# Patient Record
Sex: Male | Born: 1975 | Race: White | Hispanic: No | Marital: Single | Smoking: Current every day smoker
Health system: Southern US, Community
[De-identification: ages and names within clinical notes are randomized; demographics above are authoritative.]

---

## 2002-08-31 ENCOUNTER — Emergency Department (HOSPITAL_COMMUNITY): Admission: EM | Admit: 2002-08-31 | Discharge: 2002-08-31 | Payer: Self-pay | Admitting: Emergency Medicine

## 2014-08-20 ENCOUNTER — Emergency Department: Payer: Self-pay | Admitting: Emergency Medicine

## 2014-08-20 LAB — COMPREHENSIVE METABOLIC PANEL
ALK PHOS: 58 U/L
ALT: 36 U/L
ANION GAP: 9 (ref 7–16)
Albumin: 4 g/dL (ref 3.4–5.0)
BILIRUBIN TOTAL: 0.5 mg/dL (ref 0.2–1.0)
BUN: 13 mg/dL (ref 7–18)
CALCIUM: 8.7 mg/dL (ref 8.5–10.1)
CHLORIDE: 105 mmol/L (ref 98–107)
Co2: 27 mmol/L (ref 21–32)
Creatinine: 0.96 mg/dL (ref 0.60–1.30)
EGFR (African American): >60
EGFR (Non-African Amer.): >60
GLUCOSE: 114 mg/dL — AB (ref 65–99)
OSMOLALITY: 282 (ref 275–301)
POTASSIUM: 3.7 mmol/L (ref 3.5–5.1)
SGOT(AST): 44 U/L — ABNORMAL HIGH (ref 15–37)
Sodium: 141 mmol/L (ref 136–145)
TOTAL PROTEIN: 7.4 g/dL (ref 6.4–8.2)

## 2014-08-20 LAB — URINALYSIS, COMPLETE
BILIRUBIN, UR: NEGATIVE
Bacteria: NONE SEEN
GLUCOSE, UR: NEGATIVE mg/dL (ref 0–75)
Leukocyte Esterase: NEGATIVE
Nitrite: NEGATIVE
PROTEIN: NEGATIVE
Ph: 6 (ref 4.5–8.0)
RBC,UR: 7 /HPF (ref 0–5)
Specific Gravity: 1.026 (ref 1.003–1.030)
Squamous Epithelial: <1

## 2014-08-20 LAB — CBC WITH DIFFERENTIAL/PLATELET
BASOS PCT: 1.1 %
Basophil #: 0.1 10*3/uL (ref 0.0–0.1)
EOS ABS: 0.2 10*3/uL (ref 0.0–0.7)
Eosinophil %: 2.8 %
HCT: 46.7 % (ref 40.0–52.0)
HGB: 15.9 g/dL (ref 13.0–18.0)
Lymphocyte #: 1.9 10*3/uL (ref 1.0–3.6)
Lymphocyte %: 21.8 %
MCH: 31.4 pg (ref 26.0–34.0)
MCHC: 34 g/dL (ref 32.0–36.0)
MCV: 93 fL (ref 80–100)
MONO ABS: 0.5 x10 3/mm (ref 0.2–1.0)
Monocyte %: 5.8 %
Neutrophil #: 6 10*3/uL (ref 1.4–6.5)
Neutrophil %: 68.5 %
PLATELETS: 198 10*3/uL (ref 150–440)
RBC: 5.05 10*6/uL (ref 4.40–5.90)
RDW: 13 % (ref 11.5–14.5)
WBC: 8.8 10*3/uL (ref 3.8–10.6)

## 2014-08-20 LAB — LIPASE, BLOOD: Lipase: 150 U/L (ref 73–393)

## 2015-04-12 ENCOUNTER — Emergency Department
Admission: EM | Admit: 2015-04-12 | Discharge: 2015-04-12 | Disposition: A | Discharge status: Home / Self Care | Payer: Worker's Compensation | Attending: Emergency Medicine | Admitting: Emergency Medicine

## 2015-04-12 ENCOUNTER — Encounter: Payer: Self-pay | Admitting: Emergency Medicine

## 2015-04-12 DIAGNOSIS — Y990 Civilian activity done for income or pay: Secondary | ICD-10-CM | POA: Diagnosis not present

## 2015-04-12 DIAGNOSIS — Y288XXA Contact with other sharp object, undetermined intent, initial encounter: Secondary | ICD-10-CM | POA: Insufficient documentation

## 2015-04-12 DIAGNOSIS — Y9389 Activity, other specified: Secondary | ICD-10-CM | POA: Diagnosis not present

## 2015-04-12 DIAGNOSIS — Z720 Tobacco use: Secondary | ICD-10-CM | POA: Insufficient documentation

## 2015-04-12 DIAGNOSIS — S61011A Laceration without foreign body of right thumb without damage to nail, initial encounter: Secondary | ICD-10-CM | POA: Insufficient documentation

## 2015-04-12 DIAGNOSIS — Y9289 Other specified places as the place of occurrence of the external cause: Secondary | ICD-10-CM | POA: Insufficient documentation

## 2015-04-12 MED ORDER — BACITRACIN-NEOMYCIN-POLYMYXIN 400-5-5000 EX OINT
TOPICAL_OINTMENT | CUTANEOUS | Status: AC
  Filled 2015-04-12: qty 1

## 2015-04-12 MED ORDER — LIDOCAINE HCL (PF) 1 % IJ SOLN
INTRAMUSCULAR | Status: AC
  Filled 2015-04-12: qty 5

## 2015-04-12 NOTE — ED Notes (Signed)
Lac to right thumb, working on his own car at work, cut with metal

## 2015-04-12 NOTE — Discharge Instructions (Signed)
Laceration Care, Adult °A laceration is a cut or lesion that goes through all layers of the skin and into the tissue just beneath the skin. °TREATMENT  °Some lacerations may not require closure. Some lacerations may not be able to be closed due to an increased risk of infection. It is important to see your caregiver as soon as possible after an injury to minimize the risk of infection and maximize the opportunity for successful closure. °If closure is appropriate, pain medicines may be given, if needed. The wound will be cleaned to help prevent infection. Your caregiver will use stitches (sutures), staples, wound glue (adhesive), or skin adhesive strips to repair the laceration. These tools bring the skin edges together to allow for faster healing and a better cosmetic outcome. However, all wounds will heal with a scar. Once the wound has healed, scarring can be minimized by covering the wound with sunscreen during the day for 1 full year. °HOME CARE INSTRUCTIONS  °For sutures or staples: °· Keep the wound clean and dry. °· If you were given a bandage (dressing), you should change it at least once a day. Also, change the dressing if it becomes wet or dirty, or as directed by your caregiver. °· Wash the wound with soap and water 2 times a day. Rinse the wound off with water to remove all soap. Pat the wound dry with a clean towel. °· After cleaning, apply a thin layer of the antibiotic ointment as recommended by your caregiver. This will help prevent infection and keep the dressing from sticking. °· You may shower as usual after the first 24 hours. Do not soak the wound in water until the sutures are removed. °· Only take over-the-counter or prescription medicines for pain, discomfort, or fever as directed by your caregiver. °· Get your sutures or staples removed as directed by your caregiver. °For skin adhesive strips: °· Keep the wound clean and dry. °· Do not get the skin adhesive strips wet. You may bathe  carefully, using caution to keep the wound dry. °· If the wound gets wet, pat it dry with a clean towel. °· Skin adhesive strips will fall off on their own. You may trim the strips as the wound heals. Do not remove skin adhesive strips that are still stuck to the wound. They will fall off in time. °For wound adhesive: °· You may briefly wet your wound in the shower or bath. Do not soak or scrub the wound. Do not swim. Avoid periods of heavy perspiration until the skin adhesive has fallen off on its own. After showering or bathing, gently pat the wound dry with a clean towel. °· Do not apply liquid medicine, cream medicine, or ointment medicine to your wound while the skin adhesive is in place. This may loosen the film before your wound is healed. °· If a dressing is placed over the wound, be careful not to apply tape directly over the skin adhesive. This may cause the adhesive to be pulled off before the wound is healed. °· Avoid prolonged exposure to sunlight or tanning lamps while the skin adhesive is in place. Exposure to ultraviolet light in the first year will darken the scar. °· The skin adhesive will usually remain in place for 5 to 10 days, then naturally fall off the skin. Do not pick at the adhesive film. °You may need a tetanus shot if: °· You cannot remember when you had your last tetanus shot. °· You have never had a tetanus   shot. If you get a tetanus shot, your arm may swell, get red, and feel warm to the touch. This is common and not a problem. If you need a tetanus shot and you choose not to have one, there is a rare chance of getting tetanus. Sickness from tetanus can be serious. SEEK MEDICAL CARE IF:   You have redness, swelling, or increasing pain in the wound.  You see a red line that goes away from the wound.  You have yellowish-white fluid (pus) coming from the wound.  You have a fever.  You notice a bad smell coming from the wound or dressing.  Your wound breaks open before or  after sutures have been removed.  You notice something coming out of the wound such as wood or glass.  Your wound is on your hand or foot and you cannot move a finger or toe. SEEK IMMEDIATE MEDICAL CARE IF:   Your pain is not controlled with prescribed medicine.  You have severe swelling around the wound causing pain and numbness or a change in color in your arm, hand, leg, or foot.  Your wound splits open and starts bleeding.  You have worsening numbness, weakness, or loss of function of any joint around or beyond the wound.  You develop painful lumps near the wound or on the skin anywhere on your body. MAKE SURE YOU:   Understand these instructions.  Will watch your condition.  Will get help right away if you are not doing well or get worse. Document Released: 10/08/2005 Document Revised: 12/31/2011 Document Reviewed: 04/03/2011 [facility] Patient Information 2015 [place], [place]. This information is not intended to replace advice given to you by your health care provider. Make sure you discuss any questions you have with your health care provider.  Keep the wound clean, dry, and covered.  Return for wound check as needed.  Return in 7-10 days for suture removal.

## 2015-04-12 NOTE — ED Notes (Signed)
Neosporin dsy to sutured area 

## 2015-04-12 NOTE — ED Notes (Signed)
Pt reports was playing around with a car and cut his right hand thumb. Pt reports was cut on the metal window regulator. Bleeding controlled at this time. Pt with approx 0.5 in laceration noted. Last tetanus 2011

## 2015-04-12 NOTE — ED Notes (Signed)
Workers comp urine obtained

## 2015-04-13 NOTE — ED Provider Notes (Signed)
[facility] Emergency Department Provider Note ____________________________________________  Time seen: 1715  I have reviewed the triage vital signs and the nursing notes.  HISTORY  Chief Complaint  Extremity Laceration  HPI Adrian Jackson is a 39 y.o. male, right-hand dominant, with laceration to the right thumb while at work today. He describes working on his own car at work, when he inadvertently slid his hand on metal and the regulator, causing a cut to the palm side of his right thumb. Bleeding is controlled at this time, and he reports a tetanus status is up-to-date as of 2011.  No past medical history on file.  There are no active problems to display for this patient.  No past surgical history on file.  No current outpatient prescriptions on file.  Allergies Review of patient's allergies indicates not on file.  No family history on file.  Social History History  Substance Use Topics  . Smoking status: Current Every Day Smoker -- 1.00 packs/day    Types: Cigarettes  . Smokeless tobacco: Not on file  . Alcohol Use: No   Review of Systems  Constitutional: Negative for fever. Eyes: Negative for visual changes. ENT: Negative for sore throat. Cardiovascular: Negative for chest pain. Respiratory: Negative for shortness of breath. Gastrointestinal: Negative for abdominal pain, vomiting and diarrhea. Genitourinary: Negative for dysuria. Musculoskeletal: Negative for back pain. Skin: Negative for rash. Thumb laceration as above. Neurological: Negative for headaches, focal weakness or numbness. ____________________________________________  PHYSICAL EXAM:  VITAL SIGNS: ED Triage Vitals  Enc Vitals Group     BP 04/12/15 1616 152/97 mmHg     Pulse Rate 04/12/15 1616 90     Resp 04/12/15 1616 20     Temp 04/12/15 1616 97.7 F (36.5 C)     Temp Source 04/12/15 1616 Oral     SpO2 04/12/15 1616 99 %     Weight 04/12/15 1616 210 lb (95.255 kg)   Height 04/12/15 1616 6' (1.829 m)     Head Cir --      Peak Flow --      Pain Score 04/12/15 1618 3     Pain Loc --      Pain Edu? --      Excl. in GC? --    Constitutional: Alert and oriented. Well appearing and in no distress. Eyes: Conjunctivae are normal. PERRL. Normal extraocular movements. ENT   Head: Normocephalic and atraumatic.   Nose: No congestion/rhinnorhea.   Mouth/Throat: Mucous membranes are moist.   Neck: Supple. No thyromegaly. Hematological/Lymphatic/Immunilogical: No cervical lymphadenopathy. Cardiovascular: Normal distal pulses. Respiratory: Normal respiratory effort.  Musculoskeletal: Nontender with normal range of motion in all extremities. Normal right thumb ROM without lag.  Neurologic:  Normal gait without ataxia. Normal speech and language. No gross focal neurologic deficits are appreciated. Skin:  Skin is warm, dry and intact. No rash noted. Psychiatric: Mood and affect are normal. Patient exhibits appropriate insight and judgment. ____________________________________________  LACERATION REPAIR Performed by: Lissa Hoard Authorized by: Lissa Hoard Consent: Verbal consent obtained. Risks and benefits: risks, benefits and alternatives were discussed Consent given by: patient Patient identity confirmed: provided demographic data Prepped and Draped in normal sterile fashion Wound explored  Laceration Location: Right thumb  Laceration Length: 1.2cm  No Foreign Bodies seen or palpated  Anesthesia: local infiltration  Digital block anesthetic: lidocaine 1% w/o epinephrine  Anesthetic total: 1 ml  Irrigation method: syringe Amount of cleaning: standard  Skin closure: 5-0 nylon  Number of  sutures: 3  Technique: interrupted  Patient tolerance: Patient tolerated the procedure well with no immediate complications. ____________________________________________  INITIAL IMPRESSION / ASSESSMENT AND PLAN / ED  COURSE  Right thumb laceration s/p suture repair.  Wound care instructions given. Return to the ED in 7-10 days for suture removal. ____________________________________________  FINAL CLINICAL IMPRESSION(S) / ED DIAGNOSES  Final diagnoses:  Laceration of thumb, right, initial encounter     Lissa Hoard, PA-C 04/13/15 0022  Phineas Semen, MD 04/14/15 [phone]

## 2021-03-27 ENCOUNTER — Emergency Department: Payer: Worker's Compensation

## 2021-03-27 ENCOUNTER — Encounter: Payer: Self-pay | Admitting: Emergency Medicine

## 2021-03-27 ENCOUNTER — Other Ambulatory Visit: Payer: Self-pay

## 2021-03-27 ENCOUNTER — Emergency Department
Admission: EM | Admit: 2021-03-27 | Discharge: 2021-03-27 | Disposition: A | Discharge status: Home / Self Care | Payer: Worker's Compensation | Attending: Emergency Medicine | Admitting: Emergency Medicine

## 2021-03-27 DIAGNOSIS — Z23 Encounter for immunization: Secondary | ICD-10-CM | POA: Diagnosis not present

## 2021-03-27 DIAGNOSIS — W458XXA Other foreign body or object entering through skin, initial encounter: Secondary | ICD-10-CM | POA: Diagnosis not present

## 2021-03-27 DIAGNOSIS — F17210 Nicotine dependence, cigarettes, uncomplicated: Secondary | ICD-10-CM | POA: Insufficient documentation

## 2021-03-27 DIAGNOSIS — Z20822 Contact with and (suspected) exposure to covid-19: Secondary | ICD-10-CM | POA: Diagnosis not present

## 2021-03-27 DIAGNOSIS — S61439A Puncture wound without foreign body of unspecified hand, initial encounter: Secondary | ICD-10-CM

## 2021-03-27 DIAGNOSIS — S6991XA Unspecified injury of right wrist, hand and finger(s), initial encounter: Secondary | ICD-10-CM | POA: Insufficient documentation

## 2021-03-27 LAB — CBC WITH DIFFERENTIAL/PLATELET
Abs Immature Granulocytes: 0.03 10*3/uL (ref 0.00–0.07)
Basophils Absolute: 0.1 10*3/uL (ref 0.0–0.1)
Basophils Relative: 1 %
Eosinophils Absolute: 0.3 10*3/uL (ref 0.0–0.5)
Eosinophils Relative: 3 %
HCT: 44.2 % (ref 39.0–52.0)
Hemoglobin: 14.9 g/dL (ref 13.0–17.0)
Immature Granulocytes: 0 %
Lymphocytes Relative: 29 %
Lymphs Abs: 3.4 10*3/uL (ref 0.7–4.0)
MCH: 31 pg (ref 26.0–34.0)
MCHC: 33.7 g/dL (ref 30.0–36.0)
MCV: 92.1 fL (ref 80.0–100.0)
Monocytes Absolute: 0.8 10*3/uL (ref 0.1–1.0)
Monocytes Relative: 7 %
Neutro Abs: 7 10*3/uL (ref 1.7–7.7)
Neutrophils Relative %: 60 %
Platelets: 257 10*3/uL (ref 150–400)
RBC: 4.8 MIL/uL (ref 4.22–5.81)
RDW: 12.8 % (ref 11.5–15.5)
WBC: 11.7 10*3/uL — ABNORMAL HIGH (ref 4.0–10.5)
nRBC: 0 % (ref 0.0–0.2)

## 2021-03-27 LAB — COMPREHENSIVE METABOLIC PANEL
ALT: 14 U/L (ref 0–44)
AST: 28 U/L (ref 15–41)
Albumin: 4.7 g/dL (ref 3.5–5.0)
Alkaline Phosphatase: 63 U/L (ref 38–126)
Anion gap: 11 (ref 5–15)
BUN: 18 mg/dL (ref 6–20)
CO2: 24 mmol/L (ref 22–32)
Calcium: 8.8 mg/dL — ABNORMAL LOW (ref 8.9–10.3)
Chloride: 104 mmol/L (ref 98–111)
Creatinine, Ser: 1.02 mg/dL (ref 0.61–1.24)
GFR, Estimated: >60 mL/min (ref >60)
Glucose, Bld: 110 mg/dL — ABNORMAL HIGH (ref 70–99)
Potassium: 3.8 mmol/L (ref 3.5–5.1)
Sodium: 139 mmol/L (ref 135–145)
Total Bilirubin: 0.9 mg/dL (ref 0.3–1.2)
Total Protein: 7.6 g/dL (ref 6.5–8.1)

## 2021-03-27 LAB — RESP PANEL BY RT-PCR (FLU A&B, COVID) ARPGX2
Influenza A by PCR: NEGATIVE
Influenza B by PCR: NEGATIVE
SARS Coronavirus 2 by RT PCR: NEGATIVE

## 2021-03-27 LAB — PROTIME-INR
INR: 1 (ref 0.8–1.2)
Prothrombin Time: 13.1 seconds (ref 11.4–15.2)

## 2021-03-27 MED ORDER — LIDOCAINE-EPINEPHRINE 2 %-1:100000 IJ SOLN: 20.0000 mL | Freq: Once | INTRAMUSCULAR | Status: DC

## 2021-03-27 MED ORDER — SODIUM CHLORIDE 0.9 % IV BOLUS
1000.0000 mL | Freq: Once | INTRAVENOUS | Status: AC
  Administered 2021-03-27: 1000 mL via INTRAVENOUS

## 2021-03-27 MED ORDER — TETANUS-DIPHTHERIA TOXOIDS TD 5-2 LFU IM INJ
0.5000 mL | INJECTION | Freq: Once | INTRAMUSCULAR | Status: AC
  Administered 2021-03-27: 0.5 mL via INTRAMUSCULAR
  Filled 2021-03-27: qty 0.5

## 2021-03-27 MED ORDER — HYDROMORPHONE HCL 1 MG/ML IJ SOLN: 1.0000 mg | Freq: Once | INTRAMUSCULAR | Status: AC

## 2021-03-27 MED ORDER — HYDROMORPHONE HCL 1 MG/ML IJ SOLN
INTRAMUSCULAR | Status: AC
  Administered 2021-03-27: 1 mg via INTRAVENOUS
  Filled 2021-03-27: qty 1

## 2021-03-27 MED ORDER — OXYCODONE-ACETAMINOPHEN 5-325 MG PO TABS: 1.0000 | ORAL_TABLET | Freq: Three times a day (TID) | ORAL | 0 refills | Status: AC | PRN

## 2021-03-27 MED ORDER — ACETAMINOPHEN 500 MG PO TABS
1000.0000 mg | ORAL_TABLET | Freq: Once | ORAL | Status: AC
  Administered 2021-03-27: 1000 mg via ORAL
  Filled 2021-03-27: qty 2

## 2021-03-27 MED ORDER — CEFAZOLIN SODIUM-DEXTROSE 2-4 GM/100ML-% IV SOLN
2.0000 g | Freq: Once | INTRAVENOUS | Status: AC
  Administered 2021-03-27: 2 g via INTRAVENOUS
  Filled 2021-03-27: qty 100

## 2021-03-27 MED ORDER — KETOROLAC TROMETHAMINE 30 MG/ML IJ SOLN
30.0000 mg | Freq: Once | INTRAMUSCULAR | Status: AC
  Administered 2021-03-27: 30 mg via INTRAVENOUS
  Filled 2021-03-27: qty 1

## 2021-03-27 MED ORDER — AMOXICILLIN-POT CLAVULANATE 875-125 MG PO TABS: 1.0000 | ORAL_TABLET | Freq: Two times a day (BID) | ORAL | 0 refills | Status: AC

## 2021-03-27 MED ORDER — KETAMINE HCL 10 MG/ML IJ SOLN
0.3000 mg/kg | Freq: Once | INTRAMUSCULAR | Status: AC
  Administered 2021-03-27: 29 mg via INTRAVENOUS
  Filled 2021-03-27: qty 1

## 2021-03-27 NOTE — ED Provider Notes (Signed)
[facility] Emergency Department Provider Note  ____________________________________________   Event Date/Time   First MD Initiated Contact with Patient 03/27/21 1218     (approximate)  I have reviewed the triage vital signs and the nursing notes.   HISTORY  Chief Complaint Foreign Body in Skin   HPI Adrian Jackson is a 45 y.o. male without significant past medical history who presents for assessment after he states he tripped and fell onto the pair pliers that went through his right hand.  This occurred immediately prior to arrival.  He denies any other injuries.  He is not sure when his last tetanus shot was.  He denies any other recent sick symptoms including headache, earache, sore throat, nausea, vomiting, diarrhea, dysuria, rash or any other recent injuries or falls.         History reviewed. No pertinent past medical history.  There are no problems to display for this patient.   History reviewed. No pertinent surgical history.  Prior to Admission medications   Medication Sig Start Date End Date Taking? Authorizing Provider  amoxicillin-clavulanate (AUGMENTIN) 875-125 MG tablet Take 1 tablet by mouth 2 (two) times daily for 10 days. 03/27/21 04/06/21 Yes Gilles Chiquito, MD  oxyCODONE-acetaminophen (PERCOCET) 5-325 MG tablet Take 1 tablet by mouth every 8 (eight) hours as needed for up to 5 days for severe pain. 03/27/21 04/01/21 Yes Gilles Chiquito, MD    Allergies Pertussis vaccines  History reviewed. No pertinent family history.  Social History Social History   Tobacco Use  . Smoking status: Current Every Day Smoker    Packs/day: 1.00    Types: Cigarettes  Substance Use Topics  . Alcohol use: No    Review of Systems  Review of Systems  Constitutional: Negative for chills and fever.  HENT: Negative for sore throat.   Eyes: Negative for pain.  Respiratory: Negative for cough and stridor.   Cardiovascular: Negative for chest pain.   Gastrointestinal: Negative for vomiting.  Musculoskeletal: Positive for myalgias ( R hand).  Skin: Negative for rash.  Neurological: Negative for seizures, loss of consciousness and headaches.  Psychiatric/Behavioral: Negative for suicidal ideas.  All other systems reviewed and are negative.     ____________________________________________   PHYSICAL EXAM:  VITAL SIGNS: ED Triage Vitals  Enc Vitals Group     BP 03/27/21 1212 95/73     Pulse Rate 03/27/21 1212 85     Resp 03/27/21 1212 20     Temp 03/27/21 1212 98.8 F (37.1 C)     Temp Source 03/27/21 1212 Oral     SpO2 03/27/21 1212 97 %     Weight 03/27/21 1211 210 lb (95.3 kg)     Height 03/27/21 1211 6' (1.829 m)     Head Circumference --      Peak Flow --      Pain Score 03/27/21 1211 10     Pain Loc --      Pain Edu? --      Excl. in GC? --    Vitals:   03/27/21 1212  BP: 95/73  Pulse: 85  Resp: 20  Temp: 98.8 F (37.1 C)  SpO2: 97%   Physical Exam Vitals and nursing note reviewed.  Constitutional:      Appearance: He is well-developed.  HENT:     Head: Normocephalic and atraumatic.     Right Ear: External ear normal.     Left Ear: External ear normal.     Nose:  Nose normal.  Eyes:     Conjunctiva/sclera: Conjunctivae normal.  Cardiovascular:     Rate and Rhythm: Normal rate and regular rhythm.     Heart sounds: No murmur heard.   Pulmonary:     Effort: Pulmonary effort is normal. No respiratory distress.     Breath sounds: Normal breath sounds.  Abdominal:     Palpations: Abdomen is soft.     Tenderness: There is no abdominal tenderness.  Musculoskeletal:     Cervical back: Neck supple.  Skin:    General: Skin is warm and dry.     Capillary Refill: Capillary refill takes less than 2 seconds.  Neurological:     Mental Status: He is alert and oriented to person, place, and time.  Psychiatric:        Mood and Affect: Mood normal.     Penetrating injury with blunt and applied suspected  and below photos with blunt end of 1 handle penetrating just proximal to the fifth digit.  No other penetrating injuries.  2+ radial pulse.  Sensation intact in the distribution of the ulnar radial and median nerves.  Patient is not able to flex and extend his fourth and fifth digits but is able to flex and extend the first through third digits.       ____________________________________________   LABS (all labs ordered are listed, but only abnormal results are displayed)  Labs Reviewed  CBC WITH DIFFERENTIAL/PLATELET - Abnormal; Notable for the following components:      Result Value   WBC 11.7 (*)    All other components within normal limits  COMPREHENSIVE METABOLIC PANEL - Abnormal; Notable for the following components:   Glucose, Bld 110 (*)    Calcium 8.8 (*)    All other components within normal limits  RESP PANEL BY RT-PCR (FLU A&B, COVID) ARPGX2  PROTIME-INR  TYPE AND SCREEN   ____________________________________________  EKG  ____________________________________________  RADIOLOGY  ED MD interpretation: No clear acute fracture.  Official radiology report(s): DG Hand Complete Right  Result Date: 03/27/2021 CLINICAL DATA:  Fall of work, Public affairs consultant through right hand. EXAM: RIGHT HAND - COMPLETE 3+ VIEW COMPARISON:  None. FINDINGS: A pair of pliers projects over the right hand with one of the handles appearing to penetrate the right hand adjacent to the fifth metacarpal, with abnormal gas in the dorsal subcutaneous tissues around this handle. This appears to be somewhat medially in the hand, and due to superimposition it is difficult to tell whether this is passing medial to the fifth metacarpal or in the space between the fourth and fifth metacarpal (although this is probably obvious on visual inspection). I not see a well-defined fracture although in particular the midshaft of the fifth metacarpal and part of the distal fourth metacarpal shaft are obscured by the  handles. Mild degenerative spurring at the first carpometacarpal articulation. IMPRESSION: 1. The handle from a pair of pliers penetrates through the medial hand adjacent to the fifth metacarpal. I not see a discrete fracture, although small portions of the mid fifth metacarpal and distal fourth metacarpal are blocked by the metal of the pliers. Electronically Signed   By: Gaylyn Rong M.D.   On: 03/27/2021 13:08    ____________________________________________   PROCEDURES  Procedure(s) performed (including Critical Care):  Procedures   ____________________________________________   INITIAL IMPRESSION / ASSESSMENT AND PLAN / ED COURSE      Patient presents with above-stated history exam for assessment of penetrating injury sustained after ground-level mechanical  fall as described above.  On arrival he is afebrile hemodynamically stable.  He denies any other injuries and has some weakness in fourth and fifth digits but otherwise seems neurovascular intact in the hand.  X-rays obtained do not show acute fracture.  Tetanus updated and patient given empiric antibiotics.  Dr. Rosita Kea with orthopedic surgery consulted.  He did remove pliers at bedside and recommended discharge with outpatient Augmentin pain control and close follow-up in his clinic.  CBC shows leukocytosis suspect reactive in the setting of trauma with WBC of 11.7 with otherwise unremarkable hemoglobin and platelets.  CMP unremarkable.  Wound cleaned and wrapped by Dr. Rosita Kea.  Discharged stable condition.  Strict precautions advised and discussed.    ____________________________________________   FINAL CLINICAL IMPRESSION(S) / ED DIAGNOSES  Final diagnoses:  Penetrating traumatic injury of hand    Medications  ceFAZolin (ANCEF) IVPB 2g/100 mL premix (2 g Intravenous New Bag/Given 03/27/21 1320)  lidocaine-EPINEPHrine (XYLOCAINE W/EPI) 2 %-1:100000 (with pres) injection 20 mL (20 mLs Intradermal Not Given 03/27/21  1311)  HYDROmorphone (DILAUDID) injection 1 mg (1 mg Intravenous Given 03/27/21 1222)  sodium chloride 0.9 % bolus 1,000 mL (1,000 mLs Intravenous New Bag/Given 03/27/21 1220)  tetanus & diphtheria toxoids (adult) (TENIVAC) injection 0.5 mL (0.5 mLs Intramuscular Given 03/27/21 1312)  ketamine (KETALAR) injection 29 mg (29 mg Intravenous Given 03/27/21 1234)  ketorolac (TORADOL) 30 MG/ML injection 30 mg (30 mg Intravenous Given 03/27/21 1317)  acetaminophen (TYLENOL) tablet 1,000 mg (1,000 mg Oral Given 03/27/21 1316)     ED Discharge Orders         Ordered    amoxicillin-clavulanate (AUGMENTIN) 875-125 MG tablet  2 times daily        03/27/21 1310    oxyCODONE-acetaminophen (PERCOCET) 5-325 MG tablet  Every 8 hours PRN        03/27/21 1310           Note:  This document was prepared using Dragon voice recognition software and may include unintentional dictation errors.   Gilles Chiquito, MD 03/27/21 1324

## 2021-03-27 NOTE — ED Notes (Signed)
Adrian Jackson from [place] called through patient coworker. No drug screen needed at this time for WC.

## 2021-03-27 NOTE — ED Notes (Signed)
Pt alert and oriented, NAD noted, able to move all extremities.

## 2021-03-27 NOTE — Consult Note (Signed)
Patient was seen in the emergency room after ear consult.  He suffered a fall at work he is holding onto pliers with his right hand and the pliers went through his hand through the palm just proximal to the metacarpal heads between the fourth and fifth and out there is a laceration on the back of the hand more proximally.  X-rays confirm this without any apparent bony involvement although there is clearly interosseous muscle damage and possibly metatarsal ligament injury.  He does seem to be able flex and extend the fingers without limitations and has intact sensation. The portion of the pliers that was dorsal was painted with Betadine and the pliers removed without difficulty grasped with pliers.  The wounds were then dressed with 4 x 4's and Kling wrap. Plan is for him to be discharged with no use of the right hand prior for 2 weeks for work.  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 He will follow-up with me in 2 days for wound check.  He may require hand therapy in the future and there is risk of infection so he will be given antibiotics pain medication and close follow-up to make sure if there is infection develops he has early irrigation debridement and drainage of any abscess.

## 2021-03-27 NOTE — ED Notes (Signed)
See triage note, pt fell at work puncturing right hand with pliers. Pliers are stuck in right hand through and through. States fingers are going numb.  Pt alert and oriented. Appears very uncomfortable.  Dr Katrinka Blazing at bedside No bleeding at this time

## 2021-03-27 NOTE — ED Notes (Signed)
Wound wrapped by MD Rosita Kea

## 2021-03-27 NOTE — Discharge Instructions (Signed)
I recommend taking 400 mg of ibuprofen in addition to your prescribed pain medicines.  Your prescribed pain medicine contains an opioid and Tylenol.  You may add Tylenol up to 1 tablet of prescribed pain medicine but do not take more than 1 g every 6 hours and no more than 4 g of Tylenol in 24 hours.

## 2021-03-28 LAB — TYPE AND SCREEN
ABO/RH(D): A POS
Antibody Screen: POSITIVE

## 2023-01-07 IMAGING — DX DG HAND COMPLETE 3+V*R*
3 series · 3 of 3 positions shown · non-contrast
Comparison: None.

CLINICAL DATA: Fall of work, pliers punched through right hand.

EXAM:
RIGHT HAND - COMPLETE 3+ VIEW

[hand ap]
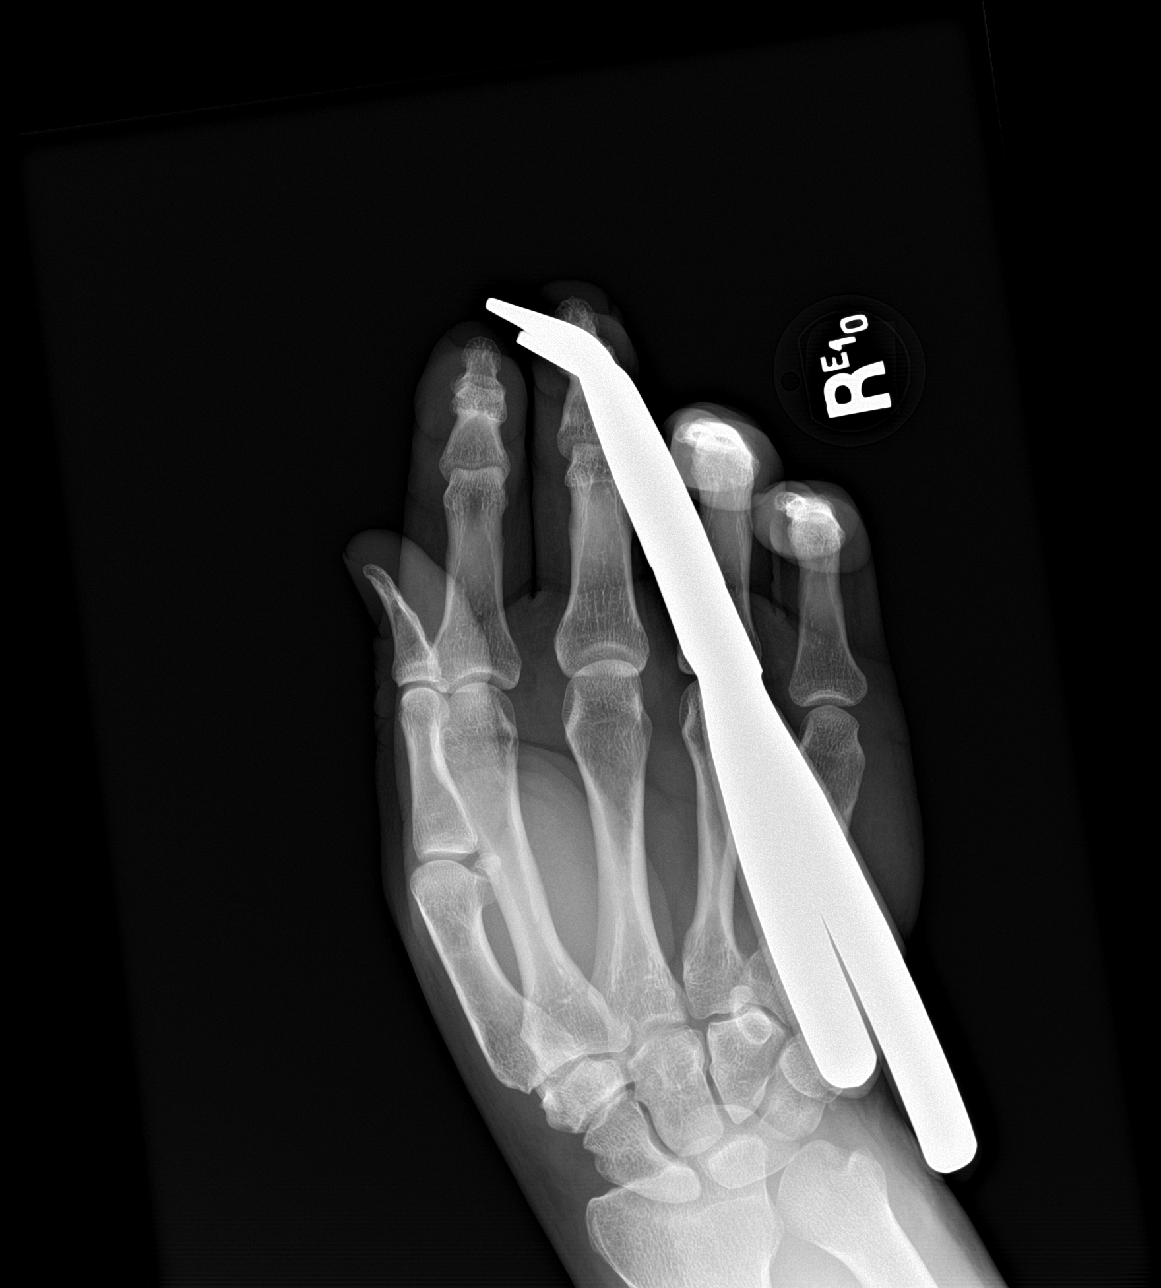

[hand obl]
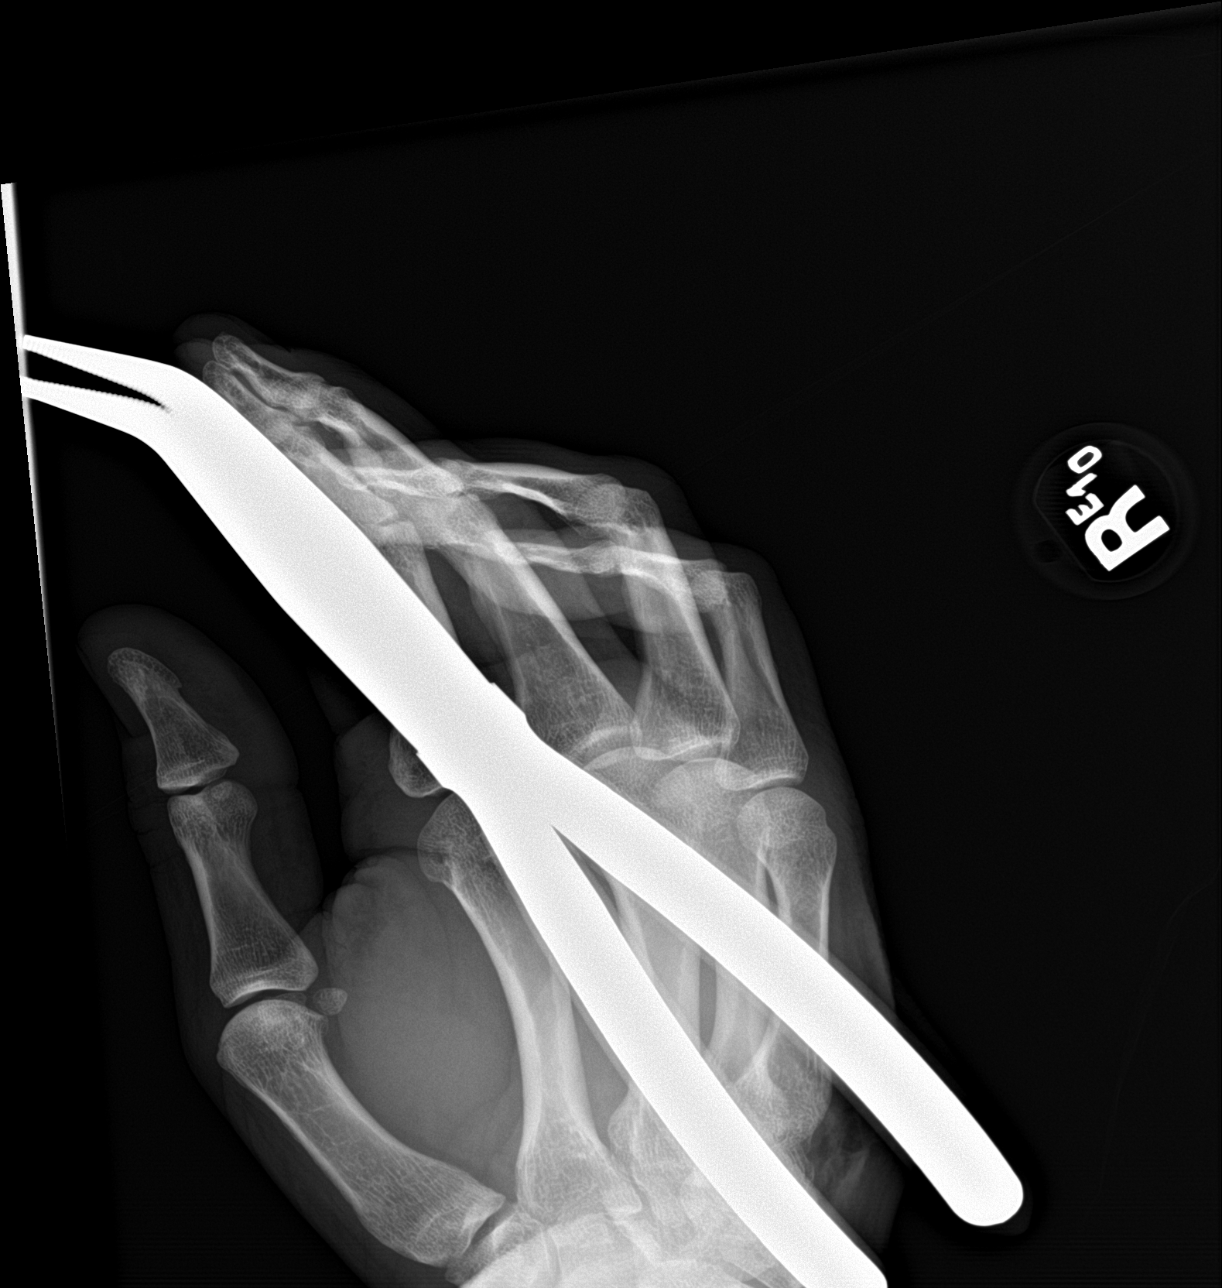

[hand lat]
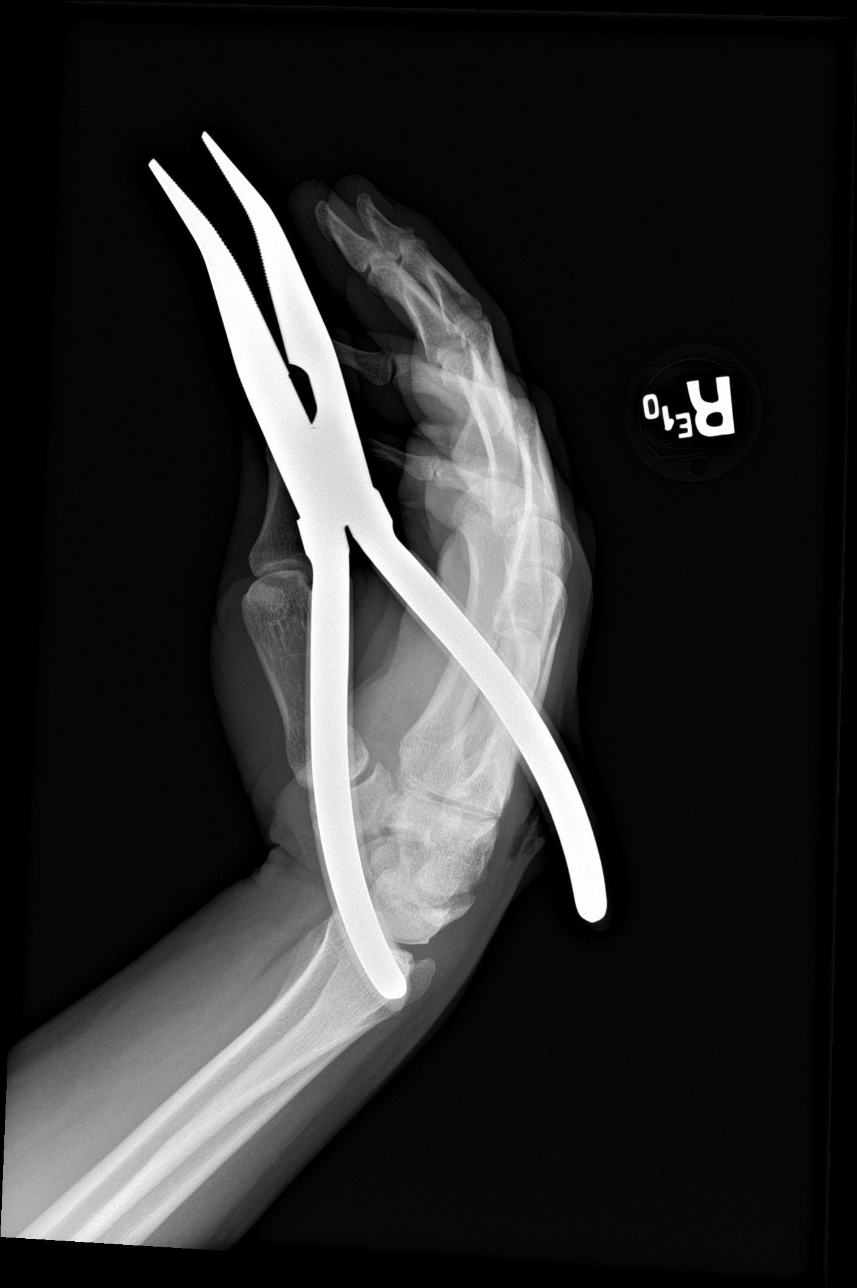

[3 of 3 positions shown; findings below may reference images not displayed]

FINDINGS: A pair of pliers projects over the right hand with one of the
handles appearing to penetrate the right hand adjacent to the fifth
metacarpal, with abnormal gas in the dorsal subcutaneous tissues
around this handle. This appears to be somewhat medially in the
hand, and due to superimposition it is difficult to tell whether
this is passing medial to the fifth metacarpal or in the space
between the fourth and fifth metacarpal (although this is probably
obvious on visual inspection). I not see a well-defined fracture
although in particular the midshaft of the fifth metacarpal and part
of the distal fourth metacarpal shaft are obscured by the handles.

Mild degenerative spurring at the first carpometacarpal
articulation.
IMPRESSION: 1. The handle from a pair of pliers penetrates through the medial
hand adjacent to the fifth metacarpal. I not see a discrete
fracture, although small portions of the mid fifth metacarpal and
distal fourth metacarpal are blocked by the metal of the pliers.
